# Patient Record
Sex: Male | Born: 1960 | Race: White | Hispanic: No | Marital: Married | Smoking: Never smoker
Health system: Southern US, Community
[De-identification: ages and names within clinical notes are randomized; demographics above are authoritative.]

---

## 2005-01-15 ENCOUNTER — Emergency Department (HOSPITAL_COMMUNITY): Admission: EM | Admit: 2005-01-15 | Discharge: 2005-01-15 | Payer: Self-pay | Admitting: Emergency Medicine

## 2018-09-27 ENCOUNTER — Emergency Department (HOSPITAL_COMMUNITY): Payer: Self-pay

## 2018-09-27 ENCOUNTER — Encounter (HOSPITAL_COMMUNITY): Payer: Self-pay

## 2018-09-27 ENCOUNTER — Emergency Department (HOSPITAL_COMMUNITY)
Admission: EM | Admit: 2018-09-27 | Discharge: 2018-09-27 | Disposition: A | Discharge status: Home / Self Care | Payer: Self-pay | Attending: Emergency Medicine | Admitting: Emergency Medicine

## 2018-09-27 ENCOUNTER — Other Ambulatory Visit: Payer: Self-pay

## 2018-09-27 DIAGNOSIS — Z20828 Contact with and (suspected) exposure to other viral communicable diseases: Secondary | ICD-10-CM | POA: Insufficient documentation

## 2018-09-27 DIAGNOSIS — E162 Hypoglycemia, unspecified: Secondary | ICD-10-CM | POA: Insufficient documentation

## 2018-09-27 DIAGNOSIS — R569 Unspecified convulsions: Secondary | ICD-10-CM | POA: Insufficient documentation

## 2018-09-27 LAB — URINALYSIS, ROUTINE W REFLEX MICROSCOPIC
Bilirubin Urine: NEGATIVE
Glucose, UA: NEGATIVE mg/dL
Ketones, ur: NEGATIVE mg/dL
Leukocytes,Ua: NEGATIVE
Nitrite: NEGATIVE
Protein, ur: 30 mg/dL — AB
Specific Gravity, Urine: 1.017 (ref 1.005–1.030)
pH: 5 (ref 5.0–8.0)

## 2018-09-27 LAB — COMPREHENSIVE METABOLIC PANEL
ALT: 27 U/L (ref 0–44)
AST: 22 U/L (ref 15–41)
Albumin: 4.4 g/dL (ref 3.5–5.0)
Alkaline Phosphatase: 62 U/L (ref 38–126)
Anion gap: 16 — ABNORMAL HIGH (ref 5–15)
BUN: 14 mg/dL (ref 6–20)
CO2: 17 mmol/L — ABNORMAL LOW (ref 22–32)
Calcium: 9 mg/dL (ref 8.9–10.3)
Chloride: 107 mmol/L (ref 98–111)
Creatinine, Ser: 1.2 mg/dL (ref 0.61–1.24)
GFR calc Af Amer: >60 mL/min (ref >60)
GFR calc non Af Amer: >60 mL/min (ref >60)
Glucose, Bld: 94 mg/dL (ref 70–99)
Potassium: 3.6 mmol/L (ref 3.5–5.1)
Sodium: 140 mmol/L (ref 135–145)
Total Bilirubin: 0.8 mg/dL (ref 0.3–1.2)
Total Protein: 7.2 g/dL (ref 6.5–8.1)

## 2018-09-27 LAB — CBG MONITORING, ED
Glucose-Capillary: 72 mg/dL (ref 70–99)
Glucose-Capillary: 79 mg/dL (ref 70–99)
Glucose-Capillary: 138 mg/dL — ABNORMAL HIGH (ref 70–99)

## 2018-09-27 LAB — CBC WITH DIFFERENTIAL/PLATELET
Abs Immature Granulocytes: 0.11 10*3/uL — ABNORMAL HIGH (ref 0.00–0.07)
Basophils Absolute: 0.1 10*3/uL (ref 0.0–0.1)
Basophils Relative: 1 %
Eosinophils Absolute: 0.2 10*3/uL (ref 0.0–0.5)
Eosinophils Relative: 2 %
HCT: 48.6 % (ref 39.0–52.0)
Hemoglobin: 16.3 g/dL (ref 13.0–17.0)
Immature Granulocytes: 1 %
Lymphocytes Relative: 33 %
Lymphs Abs: 3.3 10*3/uL (ref 0.7–4.0)
MCH: 31.3 pg (ref 26.0–34.0)
MCHC: 33.5 g/dL (ref 30.0–36.0)
MCV: 93.5 fL (ref 80.0–100.0)
Monocytes Absolute: 1 10*3/uL (ref 0.1–1.0)
Monocytes Relative: 10 %
Neutro Abs: 5.4 10*3/uL (ref 1.7–7.7)
Neutrophils Relative %: 53 %
Platelets: 295 10*3/uL (ref 150–400)
RBC: 5.2 MIL/uL (ref 4.22–5.81)
RDW: 11.8 % (ref 11.5–15.5)
WBC: 10.1 10*3/uL (ref 4.0–10.5)
nRBC: 0 % (ref 0.0–0.2)

## 2018-09-27 LAB — RAPID URINE DRUG SCREEN, HOSP PERFORMED
Amphetamines: NOT DETECTED
Barbiturates: NOT DETECTED
Benzodiazepines: NOT DETECTED
Cocaine: NOT DETECTED
Opiates: NOT DETECTED
Tetrahydrocannabinol: NOT DETECTED

## 2018-09-27 LAB — TROPONIN I (HIGH SENSITIVITY): Troponin I (High Sensitivity): 4 ng/L (ref <18)

## 2018-09-27 LAB — ETHANOL: Alcohol, Ethyl (B): <10 mg/dL (ref <10)

## 2018-09-27 MED ORDER — SODIUM CHLORIDE 0.9 % IV BOLUS
1000.0000 mL | Freq: Once | INTRAVENOUS | Status: AC
  Administered 2018-09-27: 1000 mL via INTRAVENOUS

## 2018-09-27 NOTE — ED Notes (Signed)
Discharge instructions reviewed with patient. Patient verbalizes understanding. VSS.   

## 2018-09-27 NOTE — ED Triage Notes (Signed)
Patient BIB EMS from bookstore, where he had witnessed seizure-like activity. Patient's wife told EMS she lowered the patient to the floor upon witnessing the patient getting "dizzy." Patient wife denies fall/head injury/LOC. EMS reports patient CBG prior to their arrival was 57 @1420 . EMS administered oral glucose package and CBG @1430  was 68. Patient was lethargic upon EMS arrival. Patient alert and oriented x4 in triage. Patient denies blood thinners. Patient denies seizure history. Patient denies taking medications, other than ambien.   EMS placed 18G left AC PIV. EMS VS: 138/70, 100ST, 98%RA, 16RR, CBG=68

## 2018-09-27 NOTE — ED Notes (Signed)
EKG completed and given to Dr. Darl Householder. Bloodwork drawn from PIV and fluids initiated. PO trial of orange juice provided. Patient updated on POC.

## 2018-09-27 NOTE — Discharge Instructions (Signed)
Your blood sugar was low and that likely is caused your seizure like activity.   Talk to your doctor and consider following up with neurologist if you have similar symptoms again. Consider getting testing for sleep apnea.   Eat regularly and don't miss any meals   Talk to your doctor about driving  Return to ER if you have another seizure, worse shortness of breath.

## 2018-09-27 NOTE — ED Notes (Signed)
Patient transported to CT 

## 2018-09-27 NOTE — ED Provider Notes (Signed)
[facility] DEPT Provider Note   CSN: [id number] Arrival date & time: 09/27/18  1418     History   Chief Complaint Chief Complaint  Patient presents with  . Near Syncope    HPI Ivan Ramos is a 58 y.o. male is a healthy here presenting with near syncope and seizure-like activity.  Patient states that he was at the bookstore and felt lightheaded and dizzy and had a seizure.  He states that he did eat breakfast but did not eat lunch.  He apparently had a possible head injury after the incident.  He takes Ambien as needed but denies any overdose. Adamantly denies taking any drugs and drinks alcohol occasionally .  He does have some intermittent shortness of breath for the last several months but that is worse at night.  Patient denies any active chest pain currently.  Denies any recent travel or sick contacts.  When EMS got there, his blood sugar was 55.  He was given some oral glucose.  He has no history of diabetes and is not currently on any Addison's that can drop his blood sugar.     The history is provided by the patient.    History reviewed. No pertinent past medical history.  There are no active problems to display for this patient.   History reviewed. No pertinent surgical history.      Home Medications    Prior to Admission medications   Not on File    Family History No family history on file.  Social History Social History   Tobacco Use  . Smoking status: Never Smoker  . Smokeless tobacco: Never Used  Substance Use Topics  . Alcohol use: Yes    Comment: occasional  . Drug use: Never     Allergies   Patient has no known allergies.   Review of Systems Review of Systems  Respiratory: Positive for shortness of breath.   Cardiovascular: Positive for near-syncope.  Neurological: Positive for seizures.  All other systems reviewed and are negative.    Physical Exam Updated Vital Signs BP 140/77   Pulse 74   Resp  (!) 21   Ht 5\' 10"  (1.778 m)   Wt 88.5 kg   SpO2 97%   BMI 27.98 kg/m   Physical Exam Vitals signs and nursing note reviewed.  Constitutional:      Appearance: Normal appearance.  HENT:     Head: Normocephalic.     Nose: Nose normal.     Mouth/Throat:     Mouth: Mucous membranes are moist.  Eyes:     Extraocular Movements: Extraocular movements intact.     Pupils: Pupils are equal, round, and reactive to light.  Neck:     Musculoskeletal: Normal range of motion and neck supple. No muscular tenderness.  Cardiovascular:     Rate and Rhythm: Normal rate and regular rhythm.     Pulses: Normal pulses.     Heart sounds: Normal heart sounds.  Pulmonary:     Effort: Pulmonary effort is normal.     Breath sounds: Normal breath sounds.  Abdominal:     General: Abdomen is flat.     Palpations: Abdomen is soft.  Musculoskeletal: Normal range of motion.     Comments: No spinal tenderness   Skin:    General: Skin is warm.     Capillary Refill: Capillary refill takes less than 2 seconds.  Neurological:     General: No focal deficit present.  Mental Status: He is alert and oriented to person, place, and time.     Comments: CN 2- 12 intact, nl strength and sensation throughout   Psychiatric:        Mood and Affect: Mood normal.      ED Treatments / Results  Labs (all labs ordered are listed, but only abnormal results are displayed) Labs Reviewed  CBC WITH DIFFERENTIAL/PLATELET - Abnormal; Notable for the following components:      Result Value   Abs Immature Granulocytes 0.11 (*)    All other components within normal limits  COMPREHENSIVE METABOLIC PANEL - Abnormal; Notable for the following components:   CO2 17 (*)    Anion gap 16 (*)    All other components within normal limits  NOVEL CORONAVIRUS, NAA (HOSP ORDER, SEND-OUT TO REF LAB; TAT 18-24 HRS)  ETHANOL  RAPID URINE DRUG SCREEN, HOSP PERFORMED  URINALYSIS, ROUTINE W REFLEX MICROSCOPIC  CBG MONITORING, ED  CBG  MONITORING, ED  TROPONIN I (HIGH SENSITIVITY)    EKG None   ED ECG REPORT   Date: 09/27/2018  Rate: 83  Rhythm: normal sinus rhythm  QRS Axis: normal  Intervals: normal  ST/T Wave abnormalities: normal  Conduction Disutrbances:none  Narrative Interpretation:   Old EKG Reviewed: none available  I have personally reviewed the EKG tracing and agree with the computerized printout as noted.   Radiology Ct Head Wo Contrast  Result Date: 09/27/2018 CLINICAL DATA:  Altered level of consciousness. EXAM: CT HEAD WITHOUT CONTRAST TECHNIQUE: Contiguous axial images were obtained from the base of the skull through the vertex without intravenous contrast. COMPARISON:  None. FINDINGS: Brain: No evidence of acute infarction, hemorrhage, hydrocephalus, extra-axial collection or mass lesion/mass effect. Vascular: No hyperdense vessel or unexpected calcification. Skull: Normal. Negative for fracture or focal lesion. Sinuses/Orbits: No acute finding. Other: None. IMPRESSION: Normal head CT. Electronically Signed   By: Lupita Raider M.D.   On: 09/27/2018 15:37    Procedures Procedures (including critical care time)  Medications Ordered in ED Medications  sodium chloride 0.9 % bolus 1,000 mL (1,000 mLs Intravenous New Bag/Given 09/27/18 1450)     Initial Impression / Assessment and Plan / ED Course  I have reviewed the triage vital signs and the nursing notes.  Pertinent labs & imaging results that were available during my care of the patient were reviewed by me and considered in my medical decision making (see chart for details).       Ivan Ramos is a 58 y.o. male here with seizure like activity. He was hypoglycemic at that time so that may have cause the seizure like activity instead of true seizure. Given possible head injury, will get CT head. Will get labs as well and PO trial.   5:37 PM CBG went up to 138 after eating. CT head and labs unremarkable. CXR clear. His SOB at night  likely OSA symptoms and has been going on for several weeks and trop neg x 1. CXR clear. COVID sent. Stable for discharge. Since its first time seizure, will not start antiepileptics and have him follow up with neurology if it occurs again. I think likely hypoglycemic event that caused seizure like activity.    Final Clinical Impressions(s) / ED Diagnoses   Final diagnoses:  None    ED Discharge Orders    None       Charlynne Pander, MD 09/27/18 1739

## 2018-09-27 NOTE — ED Notes (Signed)
Pt has finished fluid bolus and has requested to use the bathroom. Provided pt with urinal and advised him to ring for assistance when he was through.

## 2018-09-28 LAB — NOVEL CORONAVIRUS, NAA (HOSP ORDER, SEND-OUT TO REF LAB; TAT 18-24 HRS): SARS-CoV-2, NAA: NOT DETECTED

## 2018-10-02 ENCOUNTER — Encounter: Payer: Self-pay | Admitting: Neurology

## 2018-10-09 ENCOUNTER — Encounter: Payer: Self-pay | Admitting: Neurology

## 2018-10-09 ENCOUNTER — Ambulatory Visit: Payer: Self-pay | Admitting: Neurology

## 2018-10-09 ENCOUNTER — Other Ambulatory Visit: Payer: Self-pay

## 2018-10-09 VITALS — BP 126/78 | HR 56 | Temp 98.1°F | Ht 70.0 in | Wt 206.0 lb

## 2018-10-09 DIAGNOSIS — G40909 Epilepsy, unspecified, not intractable, without status epilepticus: Secondary | ICD-10-CM

## 2018-10-09 DIAGNOSIS — G4700 Insomnia, unspecified: Secondary | ICD-10-CM

## 2018-10-09 DIAGNOSIS — E162 Hypoglycemia, unspecified: Secondary | ICD-10-CM

## 2018-10-09 NOTE — Progress Notes (Signed)
SLEEP MEDICINE CLINIC    Provider:  Melvyn Novas, MD  Primary Care Physician:  Farris Has, MD [address] [place] [address]     Referring Provider: Farris Has, Md [address] [place],  [place] [address]          Chief Complaint according to patient   Patient presents with:     New Patient (Initial Visit)      the patient has ben using ambien for about 10 years as needed, sleep is not restorative, wife noted apnea. , gasping.  This consult is primarily about a possible seizure/ syncope.       HISTORY OF PRESENT ILLNESS:  Ivan Ramos is a 58 y.o. year old White or Caucasian male patient seen here as a referral on 10/09/2018 from [place] , MD for a  Seizure/ Syncope / Insomnia work up.    Chief concern according to patient :    The patient experienced a normal morning , had breakfast. He and his wife went to [employer] and at 2 Pm the patient went into a seizure- he did not fall  because his wife caught him. He was rigid, tonic , eyes open, unaware of his suroundings. Lasting 2 minutes. Confused afterwards, aphasic, or slurred speech, low glucose 55 by EMS, was given glucose po, and he recovered. ER gave another glass of Juice twice. It took a while to get his blood glucose up.   Hypoglycemic seizures?    I have the pleasure of seeing Ivan Ramos today, a right-handed  male with a possible sleep disorder. He has a history of insomnia, allergic rhinitis, hay fever, and now a seizure. Has anxiety.  Sleep relevant medical history:None.    Family medical /sleep history: no other family member with OSA, insomnia, no sleep walkers.    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   and lives in a household with 2 persons. Family status is married , with 2 adult sons, 62 and 67 years old.The patient currently works from home .Pets are not   present. Tobacco use; none .  ETOH use; socially,  Caffeine intake in form of Coffee( 1-2 ) Soda( 1/ week ) Tea ( rare ) and no energy drinks. Regular exercise ; walking.    Sleep habits are as follows: The patient's dinner time is between 6-7 PM.  The patient goes to bed at 11 PM and continues to sleep for a total of 6 hours, wakes up at 5.30 and stays up.  The preferred sleep position is on the side ( he is not snoring) with the support of 1-2 pillows.  Dreams are reportedly requent/vivid.  8 AM is the usual rise time.  He reports not feeling refreshed or restored in AM, with symptoms such as dry mouth. Naps are taken infrequently.   Review of Systems:   Out of a complete 14 system review, the patient complains of only the following symptoms, and all other reviewed systems are negative.:  Fatigue,  May be snoring, fragmented sleep, Insomnia - early morning wakefulness-  Seizure single .   How likely are you to doze in the following situations: 0 = not likely, 1 = slight chance, 2 = moderate chance, 3 = high chance   Sitting and Reading? Watching Television? Sitting inactive in a public place (theater or meeting)? As a passenger in a car for  an hour without a break? Lying down in the afternoon when circumstances permit? Sitting and talking to someone? Sitting quietly after lunch without alcohol? In a car, while stopped for a few minutes in traffic?    Yawning  Total = 3*/ 24 points   FSS endorsed at 12/ 63 points.   Social History   Socioeconomic History   Marital status: Married    Spouse name: Not on file   Number of children: Not on file   Years of education: Not on file   Highest education level: Not on file  Occupational History   Not on file  Social Needs   Financial resource strain: Not on file   Food insecurity    Worry: Not on file    Inability: Not on file   Transportation needs    Medical: Not on file    Non-medical: Not on file  Tobacco Use    Smoking status: Never Smoker   Smokeless tobacco: Never Used  Substance and Sexual Activity   Alcohol use: Yes    Comment: occasional   Drug use: Never   Sexual activity: Not on file  Lifestyle   Physical activity    Days per week: Not on file    Minutes per session: Not on file   Stress: Not on file  Relationships   Social connections    Talks on phone: Not on file    Gets together: Not on file    Attends religious service: Not on file    Active member of club or organization: Not on file    Attends meetings of clubs or organizations: Not on file    Relationship status: Not on file  Other Topics Concern   Not on file  Social History Narrative   Not on file    No family history on file. no epilepsy, father had skin cancer, mother had breast cancer, both had CAD, emphysema. Both were smokers. Eldest sister has lung cancer.  No past surgical history on file.  He was a competitive athlete:  3 times knee- arthroscopy, elbow tendon repair , shoulder impingement.   Current Outpatient Medications on File Prior to Visit  Medication Sig Dispense Refill   hydrOXYzine (ATARAX/VISTARIL) 50 MG tablet Take 50 mg by mouth at bedtime as needed.     loratadine (CLARITIN) 10 MG tablet Take 10 mg by mouth daily.     zolpidem (AMBIEN) 10 MG tablet Take 5 mg by mouth at bedtime as needed for sleep.     No current facility-administered medications on file prior to visit.     Physical exam:  Today's Vitals   10/09/18 1457  BP: 126/78  Pulse: (!) 56  Temp: 98.1 F (36.7 C)  Weight: 206 lb (93.4 kg)  Height: 5\' 10"  (1.778 m)   Body mass index is 29.56 kg/m.   Wt Readings from Last 3 Encounters:  10/09/18 206 lb (93.4 kg)  09/27/18 195 lb (88.5 kg)     Ht Readings from Last 3 Encounters:  10/09/18 5\' 10"  (1.778 m)  09/27/18 5\' 10"  (1.778 m)      General: The patient is awake, alert and appears not in acute distress. The patient is well groomed. Head:  Normocephalic, atraumatic. Neck is supple. Mallampati 3  neck circumference:16. 5 inches . Nasal airflow congestion,  Retrognathia is not seen.  Dental status:  Cardiovascular:  Regular rate and cardiac rhythm by pulse,  without distended neck veins. Respiratory: Lungs are clear to auscultation.  Skin:  Without evidence of ankle edema, or rash. Trunk: The patient's posture is erect.   Neurologic exam : The patient is awake and alert, oriented to place and time.   Memory subjective described as intact.  Attention span & concentration ability appears normal.  Speech is fluent,  without  dysarthria, dysphonia or aphasia.  Mood and affect are appropriate.   Cranial nerves: no loss of smell or taste reported  Pupils are equal and briskly reactive to light. Funduscopic exam deferred.   Extraocular movements in vertical and horizontal planes were intact and without nystagmus. No Diplopia. Visual fields by finger perimetry are intact. Hearing was intact to soft voice and finger rubbing.    Facial sensation intact to fine touch.  Facial motor strength is symmetric and tongue and uvula move midline.  Neck ROM : rotation, tilt and flexion extension were normal for age and shoulder shrug was symmetrical.    Motor exam:  Symmetric bulk, tone and ROM.   Normal tone without cog wheeling, symmetric grip strength .   Sensory:  Fine touch, pinprick and vibration were tested  and  normal.  Proprioception tested in the upper extremities was normal.   Coordination: Rapid alternating movements in the fingers/hands were of normal speed.  The Finger-to-nose maneuver was intact without evidence of ataxia, dysmetria or tremor.   Gait and station: Patient could rise unassisted from a seated position, walked without assistive device.  Stance is of normal width/ base and the patient turned with  3 steps.  Toe and heel walk were deferred.  Deep tendon reflexes: in the  upper and lower extremities are symmetric  and intact.  Babinski response was deferred .       After spending a total time of 45 minutes face to face and additional time for physical and neurologic examination, review of laboratory studies,  personal review of imaging studies, reports and results of other testing and review of referral information / records as far as provided in visit, I have established the following assessments:  1) no previous seizure no previous LOC.  At age 67, this is concerning . This was described as a generalized seizure .   Felt dizzy and unable to stand- and as soon as he stood up he seized.   2)  Sleep is secondary - will order EEG sleep study.   3) hypoglycemia- unknown cause- PCP has done work up, negative.  He has seen dr. Nehemiah Settle endocrinology, wear a glucose monitor.    My Plan is to proceed with:  1) EEG/ norm;a duration   2) MRI - ED did CT, which was normal.  3) PSG with expanded EEG foregone due to self pay- will do HST when EEG and MRI have been read and reviewed.    I would like to thank London Pepper, MD and London Pepper, Md [address] [place],  [place] [id number] for allowing me to meet with and to take care of this pleasant patient.   In short, Ivan Ramos is presenting with a new onset seizure in the setting of hypoglycemia. I plan to follow up either personally or through our NP within 8month.   CC: I will share my notes with PCP.  Electronically signed by: Larey Seat, MD 10/09/2018 3:21 PM  [facility] [place] certified by The [employer] of Sleep Medicine and Diplomate of the [employer] of Sleep Medicine. Board certified In Neurology through the [place], Fellow of the [employer]  Academy of Neurology. Medical Director of [facility].

## 2018-10-10 ENCOUNTER — Telehealth: Payer: Self-pay | Admitting: Neurology

## 2018-10-10 NOTE — Telephone Encounter (Signed)
self pay order sent to GI. They will reach out to the patient to schedule.  °

## 2018-10-28 ENCOUNTER — Other Ambulatory Visit: Payer: Self-pay

## 2018-10-28 ENCOUNTER — Ambulatory Visit
Admission: RE | Admit: 2018-10-28 | Discharge: 2018-10-28 | Disposition: A | Discharge status: Home / Self Care | Payer: No Typology Code available for payment source | Source: Ambulatory Visit | Attending: Neurology | Admitting: Neurology

## 2018-10-28 DIAGNOSIS — G40909 Epilepsy, unspecified, not intractable, without status epilepticus: Secondary | ICD-10-CM

## 2018-10-28 DIAGNOSIS — G4700 Insomnia, unspecified: Secondary | ICD-10-CM

## 2018-10-28 DIAGNOSIS — E162 Hypoglycemia, unspecified: Secondary | ICD-10-CM

## 2018-10-28 MED ORDER — GADOBENATE DIMEGLUMINE 529 MG/ML IV SOLN
20.0000 mL | Freq: Once | INTRAVENOUS | Status: AC | PRN
  Administered 2018-10-28: 20 mL via INTRAVENOUS

## 2018-10-29 ENCOUNTER — Encounter: Payer: Self-pay | Admitting: Neurology

## 2018-10-30 ENCOUNTER — Telehealth: Payer: Self-pay | Admitting: Neurology

## 2018-10-30 ENCOUNTER — Other Ambulatory Visit: Payer: Self-pay

## 2018-10-30 ENCOUNTER — Ambulatory Visit: Payer: Self-pay | Admitting: Neurology

## 2018-10-30 DIAGNOSIS — G40909 Epilepsy, unspecified, not intractable, without status epilepticus: Secondary | ICD-10-CM

## 2018-10-30 DIAGNOSIS — E162 Hypoglycemia, unspecified: Secondary | ICD-10-CM

## 2018-10-30 DIAGNOSIS — G4700 Insomnia, unspecified: Secondary | ICD-10-CM

## 2018-10-30 NOTE — Telephone Encounter (Signed)
Called the patient and left a detailed message informing the pt of the MRI brain results being normal. Advised the patient I also sent a mychart message informing him of this and advised the patient that if he has any questions he may contact the office at [phone].   If patient calls back please advise the MRI of brain was normal and Dr Brett Fairy didn't see anything of clinical concerns.

## 2018-10-30 NOTE — Telephone Encounter (Signed)
-----   Message from Larey Seat, MD sent at 10/29/2018 12:48 PM EDT ----- Brain volume appears normal.   The ventricles are normal in size and without distortion.The cerebellum and brainstem appears normal.   The deep gray matter appears normal.  Incidental note is made of an expanded perivascular space in the right thalamus.  The cerebral hemispheres appear normal.   Structures of the medial temporal lobes appear normal.    IMPRESSION: This is a normal MRI of the brain with and without contrast.  Please send cc to PCP, Dr Orland Mustard.

## 2018-11-20 ENCOUNTER — Encounter: Payer: Self-pay | Admitting: Neurology

## 2018-11-20 NOTE — Procedures (Signed)
I am dictating an EEG recording for the patient Ivan Ramos, dated 30 October 2018.  EEG recording time is 26 minutes and 55 seconds.  This patient has been referred by Blane Ohara, MD and primary care physician.  The patient is suspected to have had a seizure.  He also has been using Ambien for about 10 years his sleep is not restorative and he had a possible syncope recently.  The study was performed with a 20 electrode nonvideo EEG with a single channel dedicated to limited EKG.  The electrodes were placed in the accordance with the international 10-20 system, they were reviewed with referential bipolar and transverse montages.  This recording begins with the patient in awake state with a posterior dominant rhythm of 9 Hz which promptly attenuates with eye opening.  There is bilaterally frontal slowing noted as well as over the F7 and F8 electrodes indicating frontal slowing in a symmetric fashion.  Photic stimulation begun at 3 Hz stimulation but led to entrainment up to 12 and 16 Hz stimulation.  No epileptiform activity was noted, the patient was then asked to perform hyperventilation.  Hyperventilation again lead to amplitude buildup but there was no significant slowing noted.  The frontal slowing that I have reported previously persisted.  The patient began yawning and finally fell asleep indicated first by phase reversal was not seen zeta electrode channel, and performed K complexes indicating non-REM sleep stages 1 and 2 were reached.  The patient was woken from sleep by the technician when the study concluded.  No epileptiform activity was noted.    This is a normal EEG for patient in the relaxed state, awake, drowsy and asleep.  Larey Seat, MD

## 2018-11-21 ENCOUNTER — Telehealth: Payer: Self-pay | Admitting: Neurology

## 2018-11-21 NOTE — Telephone Encounter (Signed)
-----   Message from Larey Seat, MD sent at 11/20/2018  2:39 PM EST ----- Normal EEG

## 2018-11-21 NOTE — Telephone Encounter (Signed)
Called the pt and there was no answer. LVM and advised (per DPR) that the EEG was read by Dr Brett Fairy and appeared normal. Instructed the pt a mychart message was also sent and informed him to reach out with any questions.

## 2018-12-11 ENCOUNTER — Ambulatory Visit: Payer: Self-pay | Admitting: Neurology

## 2018-12-30 ENCOUNTER — Telehealth: Payer: Self-pay | Admitting: Neurology

## 2018-12-30 NOTE — Telephone Encounter (Signed)
Patient called to advise he has been experiencing double vision and next available is mid/end of Feb. Patient declined to schedule due to it being far out.   Patient states he was advised by his optometrist to get a CT for his optic nerve #6   Please follow up

## 2018-12-30 NOTE — Telephone Encounter (Signed)
Called the patient and advised that we can see him tomorrow at 2:30 pm due to a cancellation. Patient accepted the apt.

## 2018-12-31 ENCOUNTER — Other Ambulatory Visit: Payer: Self-pay

## 2018-12-31 ENCOUNTER — Ambulatory Visit (INDEPENDENT_AMBULATORY_CARE_PROVIDER_SITE_OTHER): Payer: Self-pay | Admitting: Neurology

## 2018-12-31 ENCOUNTER — Encounter: Payer: Self-pay | Admitting: Neurology

## 2018-12-31 VITALS — BP 119/74 | HR 68 | Temp 97.7°F | Ht 70.0 in | Wt 202.0 lb

## 2018-12-31 DIAGNOSIS — H532 Diplopia: Secondary | ICD-10-CM

## 2018-12-31 DIAGNOSIS — H4922 Sixth [abducent] nerve palsy, left eye: Secondary | ICD-10-CM

## 2018-12-31 MED ORDER — PREDNISONE 5 MG PO TABS: ORAL_TABLET | ORAL | 0 refills | Status: AC

## 2018-12-31 NOTE — Patient Instructions (Addendum)
Diplopia Diplopia is a condition in which a person sees two of a single object. It is also called double vision. There are two types   We consider an MRA or CT A if these steps do not abbreviate his symptoms.    of diplopia.  Monocular diplopia. This is double vision that affects only one eye. Monocular diplopia is often caused by a clouding of the lens in your eye (cataract) or by a problem in the way your eye focuses light.  Binocular diplopia. This is double vision that affects both eyes. However, when you shut one eye, the double vision will go away. Binocular diplopia may be more serious. It can be caused by: ? Problems with the nerves or muscles that are responsible for eye movement. ? Disease of the nerves (neurologic disease). ? Immune system conditions, such as Graves' disease. ? Migraine headaches. ? Tumors. ? An infection. ? A stroke. ? An injury. There are many causes of diplopia. Some are not dangerous and can be easily corrected. Diplopia may also be a symptom of a serious medical problem. You may need to see a health care provider who specializes in eye conditions (ophthalmologist) or a nerve specialist (neurologist) to find the cause. Follow these instructions at home:   Pay attention to any changes in your vision. Tell your health care provider about them.  Do not drive or operate heavy machinery if diplopia interferes with your vision.  Keep all follow-up visits as told by your health care provider. This is important. Contact a health care provider if:  Your diplopia gets worse.  You develop any other symptoms along with your diplopia, such as: ? Weakness. ? Numbness. ? Headache. ? Eye pain. ? Clumsiness. ? Nausea. ? Drooping eyelids. ? Abnormal movement of one eye. Get help right away if you:  Have sudden vision loss.  Suddenly get a very bad headache.  Have sudden weakness or numbness.  Suddenly lose the ability to speak, understand speech, or  both. These symptoms may represent a serious problem that is an emergency. Do not wait to see if the symptoms will go away. Get medical help right away. Call your local emergency services (911 in the [place] Do not drive yourself to the hospital. Summary  Diplopia is a condition in which a person sees two of a single object. It is also called double vision.  Monocular diplopia is double vision that affects only one eye. It is often caused by a clouding of the lens in your eye (cataract) or by a problem in the way your eye focuses light.  Binocular diplopia is double vision that affects both eyes. However, when you shut one eye, the double vision will go away. Binocular diplopia may be more serious.  If you have diplopia, you may need to see a health care provider who specializes in eye conditions (ophthalmologist) or a nerve specialist (neurologist) to find the cause. This information is not intended to replace advice given to you by your health care provider. Make sure you discuss any questions you have with your health care provider. Document Released: 10/21/2003 Document Revised: 12/13/2016 Document Reviewed: 12/13/2016 Elsevier Patient Education  2020 [employer].

## 2018-12-31 NOTE — Progress Notes (Signed)
SLEEP MEDICINE CLINIC    Provider:  Larey Seat, MD  Primary Care Physician:  London Pepper, MD [place] [address]     Referring Provider: London Pepper, Md [address] [place],  [place] [address]          Chief Complaint according to patient   Patient presents with:    . New Patient (Initial Visit)      the patient has ben using ambien for about 10 years as needed, sleep is not restorative, wife noted apnea. , gasping.  This consult is primarily about a possible seizure/ syncope.   pt states that recently developed a sinus infection and went to PCP to get medisbefore they took a trip to [facility].  He was started on augmentin and within 24 hrs after taking the medication he noticed vision changing to  fuzzy and then progressed  to double vision.- all while in [place] . There he saw a opthmologist who evaluated and felt that this could be possible damage to cranial nerve 6.  He states that everything on test was nl. /no damage but his eyes not working together.      HISTORY OF PRESENT ILLNESS:  Ivan Ramos is a 58 y.o. year old White or Caucasian male patient was seen  here beg fore in a referral on 12/31/2018 from Orland Mustard , MD for a  Seizure/ Syncope / Insomnia work up.   Now presenting with visual changes.  Story line - see above.   Mr. Bubb noticed the diplopia before travelling to [place] and worsening to where he could not safely drive.  By the time the couple flew  to [place] he felt that his diplopia had with the maximum point.  it has not improved. The flight back was 24th December 2020. Headaches and nausea were associated . No fever. No cough, no loss of taste or smell.  He had no brain MRI - following diplopia abut had one in the seizure work up here in 10-2018.           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   and lives in a household with 2 persons. Family status is married , with 2 adult sons, 66 and 8 years old.The patient currently works from home .Pets are not  present. Tobacco use; none .  ETOH use; socially,  Caffeine intake in form of Coffee( 1-2 ) Soda( 1/ week ) Tea ( rare ) and no energy drinks. Regular exercise ; walking.    Review of Systems:   Out of a complete 14 system review, the patient complains of only the following symptoms, and all other reviewed systems are negative.:  Fatigue,  May be snoring, fragmented sleep, Insomnia - early morning wakefulness-  Seizure single . Diplopia FSS endorsed at 12/ 63 points.   Social History   Socioeconomic History  . Marital status: Married    Spouse name: Not on file  . Number of children: Not on file  . Years of education: Not on file  . Highest education level: Not on file  Occupational History  . Not on file  Tobacco Use  . Smoking status: Never Smoker  . Smokeless tobacco: Never Used  Substance and Sexual Activity  . Alcohol use: Yes    Comment: occasional  .  Drug use: Never  . Sexual activity: Not on file  Other Topics Concern  . Not on file  Social History Narrative  . Not on file   Social Determinants of Health   Financial Resource Strain:   . Difficulty of Paying Living Expenses: Not on file  Food Insecurity:   . Worried About Programme researcher, broadcasting/film/videounning Out of Food in the Last Year: Not on file  . Ran Out of Food in the Last Year: Not on file  Transportation Needs:   . Lack of Transportation (Medical): Not on file  . Lack of Transportation (Non-Medical): Not on file  Physical Activity:   . Days of Exercise per Week: Not on file  . Minutes of Exercise per Session: Not on file  Stress:   . Feeling of Stress : Not on file  Social Connections:   . Frequency of Communication with Friends and Family: Not on file  . Frequency of Social Gatherings with Friends and Family: Not on file   . Attends Religious Services: Not on file  . Active Member of Clubs or Organizations: Not on file  . Attends BankerClub or Organization Meetings: Not on file  . Marital Status: Not on file    No family history on file. no epilepsy, father had skin cancer, mother had breast cancer, both had CAD, emphysema. Both were smokers.  Eldest sister has lung cancer.  No past surgical history on file.  He was a competitive athlete:  3 times knee- arthroscopy, elbow tendon repair , shoulder impingement.   Current Outpatient Medications on File Prior to Visit  Medication Sig Dispense Refill  . loratadine (CLARITIN) 10 MG tablet Take 10 mg by mouth daily.    Marland Kitchen. zolpidem (AMBIEN) 10 MG tablet Take 5 mg by mouth at bedtime as needed for sleep.     No current facility-administered medications on file prior to visit.    Physical exam:  Today's Vitals   12/31/18 1435  BP: 119/74  Pulse: 68  Temp: 97.7 F (36.5 C)  Weight: 202 lb (91.6 kg)  Height: 5\' 10"  (1.778 m)   Body mass index is 28.98 kg/m.   Wt Readings from Last 3 Encounters:  12/31/18 202 lb (91.6 kg)  10/09/18 206 lb (93.4 kg)  09/27/18 195 lb (88.5 kg)     Ht Readings from Last 3 Encounters:  12/31/18 5\' 10"  (1.778 m)  10/09/18 5\' 10"  (1.778 m)  09/27/18 5\' 10"  (1.778 m)      General: The patient is awake, alert and appears not in acute distress. The patient is well groomed. Head: Normocephalic, atraumatic. Neck is supple. Mallampati 3  neck circumference:16. 5 inches . Nasal airflow congestion,  Retrognathia is not seen.  Dental status:  Cardiovascular:  Regular rate and cardiac rhythm by pulse,  without distended neck veins. Respiratory: Lungs are clear to auscultation.  Skin:  Without evidence of ankle edema, or rash. Trunk: The patient's posture is erect.   Neurologic exam : The patient is awake and alert, oriented to place and time.   Memory subjective described as intact.  Attention span & concentration ability  appears normal.  Speech is fluent,  without  dysarthria, dysphonia or aphasia.  Mood and affect are appropriate.   Cranial nerves: no loss of smell or taste reported  Pupils are equal and briskly reactive to light. Funduscopic exam  without abnormalities of either retina. Extraocular movements in vertical and horizontal planes were intact and without nystagmus. Horizontal  Diplopia by red lens testing-  not skewed.  Visual fields by finger perimetry are intact. Hearing was intact to soft voice and finger rubbing.    Facial sensation intact to fine touch.  Facial motor strength is symmetric and tongue and uvula move midline.  Neck ROM : rotation, tilt and flexion extension were normal for age and shoulder shrug was symmetrical.    Motor exam:  Symmetric bulk, tone and ROM.   Normal tone without cog wheeling, symmetric grip strength .   Sensory:  Fine touch, pinprick and vibration were tested  and  normal.  Proprioception tested in the upper extremities was normal.   Coordination: Rapid alternating movements in the fingers/hands were of normal speed.  The Finger-to-nose maneuver was intact without evidence of ataxia, dysmetria or tremor.   Gait and station: Patient could rise unassisted from a seated position, walked without assistive device.  Stance is of normal width/ base and the patient turned with  3 steps.  Toe and heel walk were deferred.  Deep tendon reflexes: in the  upper and lower extremities are symmetric and intact.  Babinski response was deferred .      After spending a total time of 45 minutes face to face and additional time for physical and neurologic examination, review of laboratory studies,  personal review of imaging studies, reports and results of other testing and review of referral information / records as far as provided in visit, I have established the following assessments:   1) single seizure with normal [ prolonged EEG.  2. Diplopia , abducens nerve - CN 6  , left eye. Painless.   3) hypoglycemia- unknown cause- PCP has done work up, negative.  He has seen dr. Ronnette Hila endocrinology, wears a glucose monitor.    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  He already purchased one.  Also, start steroid for 0 days, short course. Oral.   We consider an MRA or CT A if these steps do not abbreviate his symptoms.     I plan to follow up either personally or through our NP within 37month.   CC: I will share my notes with PCP.  Electronically signed by: Melvyn Novas, MD 12/31/2018 3:06 PM  [facility] Board certified by The [employer] of Sleep Medicine and Diplomate of the [employer] of Sleep Medicine. Board certified In Neurology through the ABPN, Fellow of the [employer] of Neurology. Medical Director of [facility].

## 2019-03-31 ENCOUNTER — Ambulatory Visit: Payer: Self-pay | Admitting: Family Medicine

## 2019-10-21 IMAGING — DX DG CHEST 1V PORT
2 series · 2 of 2 positions shown · non-contrast
Comparison: None.

CLINICAL DATA: Pt reported chronic SOB for over 5 months.

EXAM:
PORTABLE CHEST 1 VIEW

[chest ap (1 of 2)]
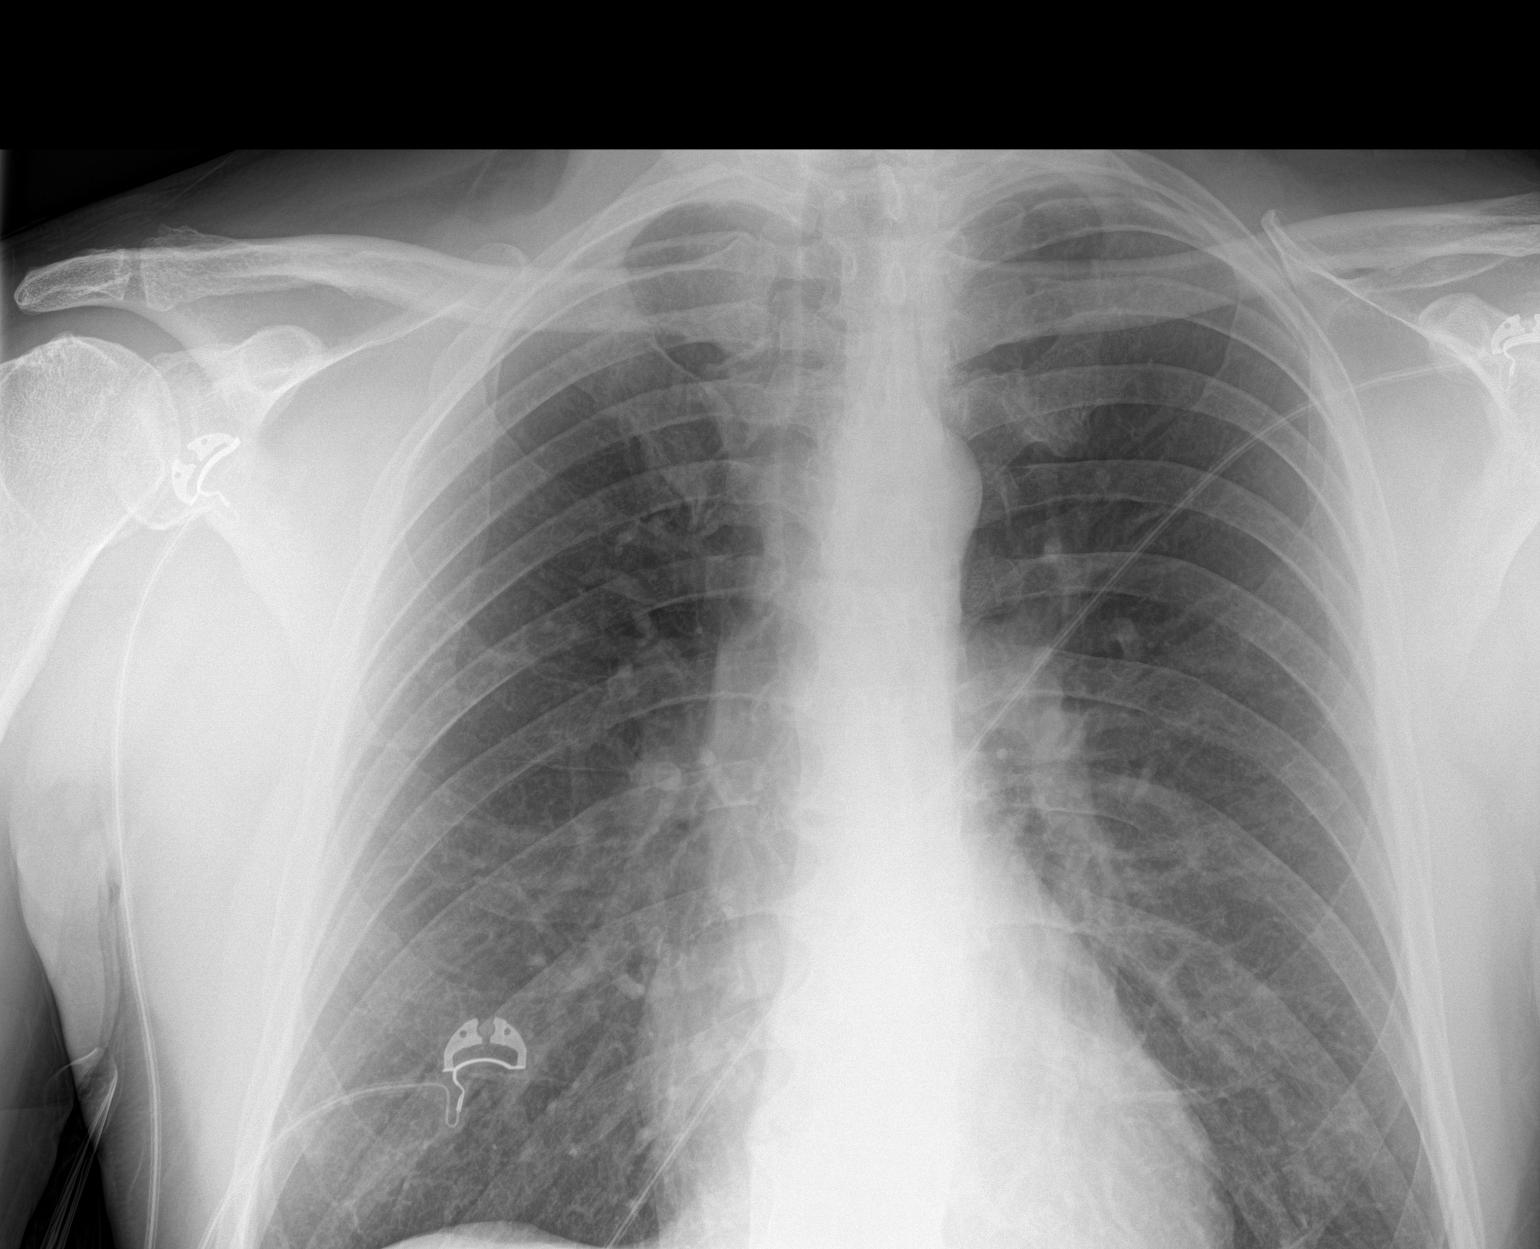

[chest ap (2 of 2)]
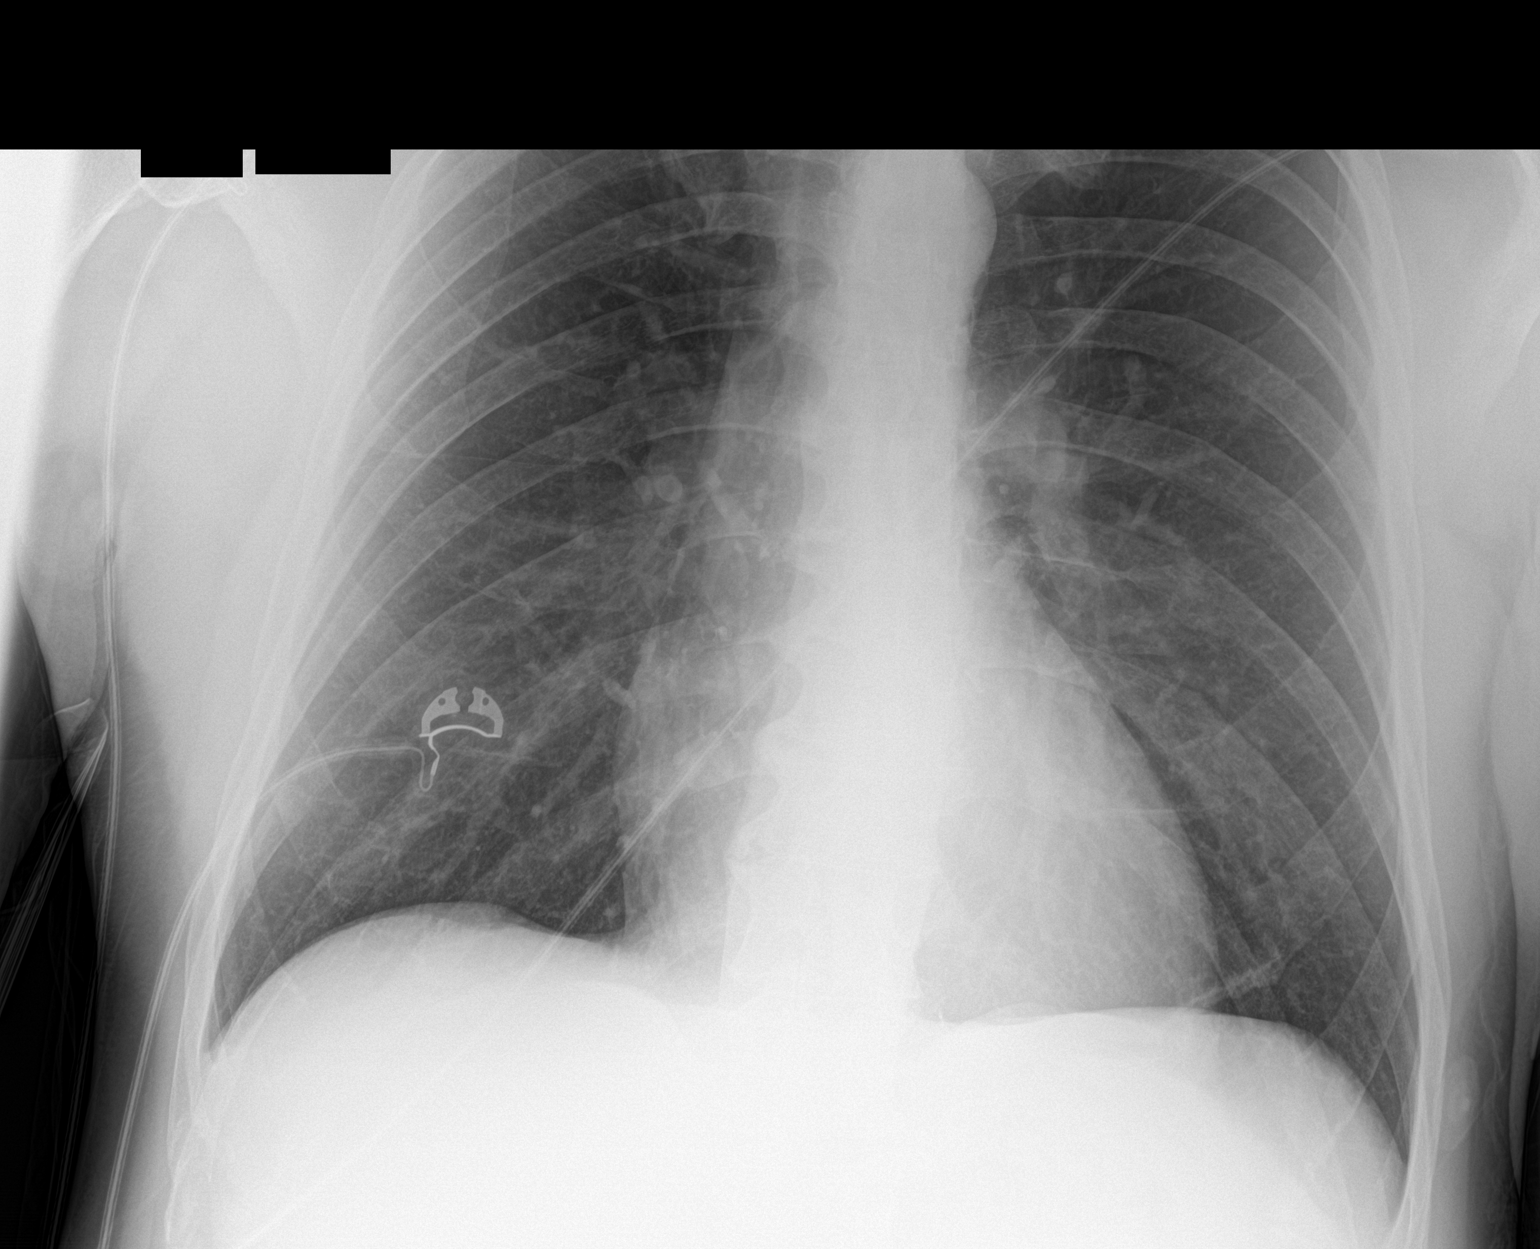

[2 of 2 positions shown; findings below may reference images not displayed]

FINDINGS: The heart size and mediastinal contours are within normal limits.
The lungs are clear. No pneumothorax or pleural effusion. The
visualized skeletal structures are unremarkable.
IMPRESSION: No acute cardiopulmonary process.

## 2023-09-26 ENCOUNTER — Other Ambulatory Visit: Payer: Self-pay | Admitting: Family Medicine

## 2023-09-26 DIAGNOSIS — H532 Diplopia: Secondary | ICD-10-CM

## 2023-10-09 ENCOUNTER — Other Ambulatory Visit: Payer: Self-pay

## 2023-10-10 ENCOUNTER — Other Ambulatory Visit: Payer: Self-pay

## 2023-12-14 ENCOUNTER — Other Ambulatory Visit (HOSPITAL_BASED_OUTPATIENT_CLINIC_OR_DEPARTMENT_OTHER): Payer: Self-pay

## 2023-12-14 ENCOUNTER — Emergency Department (HOSPITAL_BASED_OUTPATIENT_CLINIC_OR_DEPARTMENT_OTHER): Payer: Self-pay | Admitting: Radiology

## 2023-12-14 ENCOUNTER — Emergency Department (HOSPITAL_BASED_OUTPATIENT_CLINIC_OR_DEPARTMENT_OTHER)
Admission: EM | Admit: 2023-12-14 | Discharge: 2023-12-14 | Disposition: A | Discharge status: Home / Self Care | Payer: Self-pay | Attending: Emergency Medicine | Admitting: Emergency Medicine

## 2023-12-14 ENCOUNTER — Other Ambulatory Visit: Payer: Self-pay

## 2023-12-14 DIAGNOSIS — M25551 Pain in right hip: Secondary | ICD-10-CM | POA: Insufficient documentation

## 2023-12-14 DIAGNOSIS — M5431 Sciatica, right side: Secondary | ICD-10-CM | POA: Insufficient documentation

## 2023-12-14 LAB — CBC WITH DIFFERENTIAL/PLATELET
Abs Immature Granulocytes: 0.02 K/uL (ref 0.00–0.07)
Basophils Absolute: 0.1 K/uL (ref 0.0–0.1)
Basophils Relative: 1 %
Eosinophils Absolute: 0.1 K/uL (ref 0.0–0.5)
Eosinophils Relative: 1 %
HCT: 47.7 % (ref 39.0–52.0)
Hemoglobin: 16.4 g/dL (ref 13.0–17.0)
Immature Granulocytes: 0 %
Lymphocytes Relative: 22 %
Lymphs Abs: 2 K/uL (ref 0.7–4.0)
MCH: 31.7 pg (ref 26.0–34.0)
MCHC: 34.4 g/dL (ref 30.0–36.0)
MCV: 92.1 fL (ref 80.0–100.0)
Monocytes Absolute: 0.8 K/uL (ref 0.1–1.0)
Monocytes Relative: 9 %
Neutro Abs: 6.2 K/uL (ref 1.7–7.7)
Neutrophils Relative %: 67 %
Platelets: 262 K/uL (ref 150–400)
RBC: 5.18 MIL/uL (ref 4.22–5.81)
RDW: 12.1 % (ref 11.5–15.5)
WBC: 9.2 K/uL (ref 4.0–10.5)
nRBC: 0 % (ref 0.0–0.2)

## 2023-12-14 LAB — BASIC METABOLIC PANEL WITH GFR
Anion gap: 10 (ref 5–15)
BUN: 14 mg/dL (ref 8–23)
CO2: 28 mmol/L (ref 22–32)
Calcium: 9.6 mg/dL (ref 8.9–10.3)
Chloride: 102 mmol/L (ref 98–111)
Creatinine, Ser: 1.06 mg/dL (ref 0.61–1.24)
GFR, Estimated: >60 mL/min (ref >60)
Glucose, Bld: 101 mg/dL — ABNORMAL HIGH (ref 70–99)
Potassium: 4 mmol/L (ref 3.5–5.1)
Sodium: 140 mmol/L (ref 135–145)

## 2023-12-14 LAB — MAGNESIUM: Magnesium: 2.1 mg/dL (ref 1.7–2.4)

## 2023-12-14 MED ORDER — CYCLOBENZAPRINE HCL 10 MG PO TABS
10.0000 mg | ORAL_TABLET | Freq: Two times a day (BID) | ORAL | 0 refills | Status: AC | PRN
  Filled 2023-12-14: qty 20, 10d supply, fill #0

## 2023-12-14 MED ORDER — DEXAMETHASONE SOD PHOSPHATE PF 10 MG/ML IJ SOLN: 10.0000 mg | Freq: Once | INTRAMUSCULAR | Status: DC

## 2023-12-14 MED ORDER — KETOROLAC TROMETHAMINE 15 MG/ML IJ SOLN: 15.0000 mg | Freq: Once | INTRAMUSCULAR | Status: DC

## 2023-12-14 MED ORDER — KETOROLAC TROMETHAMINE 15 MG/ML IJ SOLN
30.0000 mg | Freq: Once | INTRAMUSCULAR | Status: AC
  Administered 2023-12-14: 30 mg via INTRAMUSCULAR
  Filled 2023-12-14: qty 2

## 2023-12-14 MED ORDER — DEXAMETHASONE SOD PHOSPHATE PF 10 MG/ML IJ SOLN
10.0000 mg | Freq: Once | INTRAMUSCULAR | Status: AC
  Administered 2023-12-14: 10 mg via INTRAMUSCULAR

## 2023-12-14 MED ORDER — METHYLPREDNISOLONE 4 MG PO TBPK
ORAL_TABLET | ORAL | 0 refills | Status: AC
  Filled 2023-12-14: qty 21, 6d supply, fill #0

## 2023-12-14 NOTE — Discharge Instructions (Signed)
 Recommend 1000 mg of Tylenol every 6 hours as needed for pain.  Take muscle relaxant Flexeril as prescribed but this medication is sedating so please be careful with its use.  Take your next dose of steroid Medrol Dosepak tomorrow morning.  Follow-up with your primary care doctor.

## 2023-12-14 NOTE — ED Triage Notes (Signed)
 Patient reports right sided hip pain for the last week. States started just in hip but now radiates down right leg and into lower back. Cramping in both legs.

## 2023-12-14 NOTE — ED Provider Notes (Signed)
 [place] EMERGENCY DEPARTMENT AT [facility] Provider Note   CSN: [id number] Arrival date & time: 12/14/23  1439     Patient presents with: Hip Pain   Ivan Ramos is a 63 y.o. male.   Patient here with right hip pain radiating down to the right thigh for the last week or so.  No difficulties using the bathroom.  Some some bilateral cramps in both legs.  He has been dealing with some chronic left knee issue that might be making the right hip act up.  He is very active person.  Denies any specific trauma otherwise.  No fever no chills.  No loss of bowel or bladder.  No weakness.  The history is provided by the patient.       Prior to Admission medications  Medication Sig Start Date End Date Taking? Authorizing Provider  cyclobenzaprine (FLEXERIL) 10 MG tablet Take 1 tablet (10 mg total) by mouth 2 (two) times daily as needed for muscle spasms. 12/14/23  Yes Curran Lenderman, DO  methylPREDNISolone (MEDROL DOSEPAK) 4 MG TBPK tablet Follow package insert 12/14/23  Yes Norvin Ohlin, DO  loratadine (CLARITIN) 10 MG tablet Take 10 mg by mouth daily.    [provider]  predniSONE  (DELTASONE ) 5 MG tablet Take 4 in AM/ 5 days. Follow with 2 in AM 5 days. Follow 1 in AM 5 days. 12/31/18   Dohmeier, Dedra, MD  zolpidem (AMBIEN) 10 MG tablet Take 5 mg by mouth at bedtime as needed for sleep.    [provider]    Allergies: Patient has no known allergies.    Review of Systems  Updated Vital Signs BP 126/82 (BP Location: Left Arm)   Pulse 75   Temp 97.8 F (36.6 C)   Resp 18   SpO2 100%   Physical Exam Vitals and nursing note reviewed.  Constitutional:      General: He is not in acute distress.    Appearance: He is well-developed.  HENT:     Head: Normocephalic and atraumatic.     Nose: Nose normal.     Mouth/Throat:     Mouth: Mucous membranes are moist.  Eyes:     Extraocular Movements: Extraocular movements intact.     Conjunctiva/sclera:  Conjunctivae normal.     Pupils: Pupils are equal, round, and reactive to light.  Cardiovascular:     Rate and Rhythm: Normal rate and regular rhythm.     Pulses: Normal pulses.     Heart sounds: Normal heart sounds. No murmur heard. Pulmonary:     Effort: Pulmonary effort is normal. No respiratory distress.     Breath sounds: Normal breath sounds.  Abdominal:     General: Abdomen is flat.     Palpations: Abdomen is soft.     Tenderness: There is no abdominal tenderness.  Musculoskeletal:        General: Tenderness (TTP to right hip, SI joint area) present. No swelling. Normal range of motion.     Cervical back: Normal range of motion and neck supple.     Comments: No midline spinal tenderness  Skin:    General: Skin is warm and dry.     Capillary Refill: Capillary refill takes less than 2 seconds.  Neurological:     General: No focal deficit present.     Mental Status: He is alert and oriented to person, place, and time.     Cranial Nerves: No cranial nerve deficit.     Sensory: No  sensory deficit.     Motor: No weakness.     Coordination: Coordination normal.     Comments: 5+ out of 5 strength, normal sensation  Psychiatric:        Mood and Affect: Mood normal.     (all labs ordered are listed, but only abnormal results are displayed) Labs Reviewed  BASIC METABOLIC PANEL WITH GFR - Abnormal; Notable for the following components:      Result Value   Glucose, Bld 101 (*)    All other components within normal limits  CBC WITH DIFFERENTIAL/PLATELET  MAGNESIUM    EKG: None  Radiology: DG Lumbar Spine Complete Result Date: 12/14/2023 CLINICAL DATA:  Lower back pain EXAM: LUMBAR SPINE - COMPLETE 4+ VIEW COMPARISON:  None Available. FINDINGS: There is no evidence of lumbar spine fracture. Alignment is normal. Mild degenerative disc disease is noted at L2-3 and L5-S1. IMPRESSION: Mild multilevel degenerative disc disease. No acute abnormality seen. Electronically Signed    By: Lynwood Landy Raddle M.D.   On: 12/14/2023 16:12   DG Hip Unilat With Pelvis 2-3 Views Right Result Date: 12/14/2023 CLINICAL DATA:  Acute right hip pain EXAM: DG HIP (WITH OR WITHOUT PELVIS) 2-3V*R* COMPARISON:  None Available. FINDINGS: There is no evidence of hip fracture or dislocation. There is no evidence of arthropathy or other focal bone abnormality. IMPRESSION: Negative. Electronically Signed   By: Lynwood Landy Raddle M.D.   On: 12/14/2023 16:10     Procedures   Medications Ordered in the ED  ketorolac (TORADOL) 15 MG/ML injection 15 mg (has no administration in time range)  dexamethasone (DECADRON) injection 10 mg (has no administration in time range)                                    Medical Decision Making Amount and/or Complexity of Data Reviewed Labs: ordered. Radiology: ordered.  Risk Prescription drug management.   Ivan Ramos is here with right hip pain muscle cramps.  Normal vitals.  No fever.  Differential diagnosis likely sciatica SI joint dysfunction in the right.  Seems less likely to be fracture.  He is having some cramps in his legs at times as well.  Will check electrolytes.  Have no concern for blood clot or arterial process.  He is neurovascular neuromuscular intact on exam.  His got good strength and sensation.  He has no loss of bowel or bladder and have no concern for cauda equina or other emergent spine issue.  He is a very active person.  He has been dealing with some chronic left knee issue that is now maybe affecting his right hip.  Will check basic labs and x-ray of his low back and right hip and anticipate giving him steroids and Toradol.  Lab work shows no significant leukocytosis anemia or electrolyte abnormality.  X-ray imaging is unremarkable.  No acute findings on x-ray of the lumbar spine or hip.  I do think that this is likely some sort of SI joint dysfunction or sciatica.  Will prescribe Flexeril and Medrol Dosepak and have him follow-up with  primary care.  Told to return if symptoms worsen.  This chart was dictated using voice recognition software.  Despite best efforts to proofread,  errors can occur which can change the documentation meaning.      Final diagnoses:  Right hip pain  Sciatica of right side    ED Discharge Orders  Ordered    cyclobenzaprine (FLEXERIL) 10 MG tablet  2 times daily PRN        12/14/23 1621    methylPREDNISolone (MEDROL DOSEPAK) 4 MG TBPK tablet        12/14/23 1621               Bryer Cozzolino, DO 12/14/23 1622
